# Patient Record
Sex: Male | Born: 1955 | Race: White | Hispanic: No | Marital: Married | Smoking: Never smoker
Health system: Southern US, Community
[De-identification: ages and names within clinical notes are randomized; demographics above are authoritative.]

## PROBLEM LIST (undated history)

## (undated) DIAGNOSIS — T7840XA Allergy, unspecified, initial encounter: Secondary | ICD-10-CM

## (undated) DIAGNOSIS — G4733 Obstructive sleep apnea (adult) (pediatric): Secondary | ICD-10-CM

## (undated) DIAGNOSIS — J309 Allergic rhinitis, unspecified: Secondary | ICD-10-CM

## (undated) DIAGNOSIS — F430 Acute stress reaction: Secondary | ICD-10-CM

## (undated) DIAGNOSIS — E559 Vitamin D deficiency, unspecified: Secondary | ICD-10-CM

## (undated) DIAGNOSIS — E7800 Pure hypercholesterolemia, unspecified: Secondary | ICD-10-CM

## (undated) DIAGNOSIS — H16009 Unspecified corneal ulcer, unspecified eye: Secondary | ICD-10-CM

## (undated) HISTORY — PX: COLONOSCOPY: SHX174

## (undated) HISTORY — DX: Allergy, unspecified, initial encounter: T78.40XA

## (undated) HISTORY — DX: Allergic rhinitis, unspecified: J30.9

## (undated) HISTORY — DX: Unspecified corneal ulcer, unspecified eye: H16.009

## (undated) HISTORY — PX: HERNIA REPAIR: SHX51

## (undated) HISTORY — DX: Vitamin D deficiency, unspecified: E55.9

## (undated) HISTORY — DX: Acute stress reaction: F43.0

## (undated) HISTORY — DX: Pure hypercholesterolemia, unspecified: E78.00

## (undated) HISTORY — PX: VASECTOMY: SHX75

## (undated) HISTORY — DX: Obstructive sleep apnea (adult) (pediatric): G47.33

---

## 2010-06-04 ENCOUNTER — Encounter: Admission: RE | Admit: 2010-06-04 | Discharge: 2010-06-04 | Payer: Self-pay | Admitting: Family Medicine

## 2010-08-01 ENCOUNTER — Ambulatory Visit (HOSPITAL_COMMUNITY)
Admission: RE | Admit: 2010-08-01 | Discharge: 2010-08-01 | Payer: Self-pay | Source: Home / Self Care | Admitting: General Surgery

## 2010-11-12 LAB — DIFFERENTIAL
Basophils Absolute: 0 10*3/uL (ref 0.0–0.1)
Basophils Relative: 0 % (ref 0–1)
Eosinophils Relative: 0 % (ref 0–5)
Monocytes Absolute: 0.4 10*3/uL (ref 0.1–1.0)
Monocytes Relative: 6 % (ref 3–12)

## 2010-11-12 LAB — BASIC METABOLIC PANEL
BUN: 9 mg/dL (ref 6–23)
CO2: 27 mEq/L (ref 19–32)
Chloride: 109 mEq/L (ref 96–112)
Glucose, Bld: 89 mg/dL (ref 70–99)
Potassium: 4.3 mEq/L (ref 3.5–5.1)

## 2010-11-12 LAB — CBC
HCT: 43.2 % (ref 39.0–52.0)
MCH: 27.8 pg (ref 26.0–34.0)
MCHC: 33.6 g/dL (ref 30.0–36.0)
MCV: 82.8 fL (ref 78.0–100.0)
RDW: 13.6 % (ref 11.5–15.5)

## 2010-11-12 LAB — SURGICAL PCR SCREEN: Staphylococcus aureus: NEGATIVE

## 2016-07-31 DIAGNOSIS — Z0000 Encounter for general adult medical examination without abnormal findings: Secondary | ICD-10-CM | POA: Diagnosis not present

## 2016-08-01 DIAGNOSIS — Z0000 Encounter for general adult medical examination without abnormal findings: Secondary | ICD-10-CM | POA: Diagnosis not present

## 2016-12-24 DIAGNOSIS — L814 Other melanin hyperpigmentation: Secondary | ICD-10-CM | POA: Diagnosis not present

## 2016-12-24 DIAGNOSIS — D225 Melanocytic nevi of trunk: Secondary | ICD-10-CM | POA: Diagnosis not present

## 2016-12-24 DIAGNOSIS — D1801 Hemangioma of skin and subcutaneous tissue: Secondary | ICD-10-CM | POA: Diagnosis not present

## 2016-12-24 DIAGNOSIS — L820 Inflamed seborrheic keratosis: Secondary | ICD-10-CM | POA: Diagnosis not present

## 2017-04-07 DIAGNOSIS — G4733 Obstructive sleep apnea (adult) (pediatric): Secondary | ICD-10-CM | POA: Diagnosis not present

## 2017-09-08 DIAGNOSIS — Z125 Encounter for screening for malignant neoplasm of prostate: Secondary | ICD-10-CM | POA: Diagnosis not present

## 2017-09-08 DIAGNOSIS — Z13220 Encounter for screening for lipoid disorders: Secondary | ICD-10-CM | POA: Diagnosis not present

## 2017-09-08 DIAGNOSIS — Z0000 Encounter for general adult medical examination without abnormal findings: Secondary | ICD-10-CM | POA: Diagnosis not present

## 2017-12-24 DIAGNOSIS — D1801 Hemangioma of skin and subcutaneous tissue: Secondary | ICD-10-CM | POA: Diagnosis not present

## 2017-12-24 DIAGNOSIS — L821 Other seborrheic keratosis: Secondary | ICD-10-CM | POA: Diagnosis not present

## 2017-12-24 DIAGNOSIS — D229 Melanocytic nevi, unspecified: Secondary | ICD-10-CM | POA: Diagnosis not present

## 2017-12-24 DIAGNOSIS — L814 Other melanin hyperpigmentation: Secondary | ICD-10-CM | POA: Diagnosis not present

## 2018-02-03 DIAGNOSIS — K625 Hemorrhage of anus and rectum: Secondary | ICD-10-CM | POA: Diagnosis not present

## 2018-10-21 DIAGNOSIS — G4733 Obstructive sleep apnea (adult) (pediatric): Secondary | ICD-10-CM | POA: Diagnosis not present

## 2018-10-25 DIAGNOSIS — Z125 Encounter for screening for malignant neoplasm of prostate: Secondary | ICD-10-CM | POA: Diagnosis not present

## 2018-10-25 DIAGNOSIS — F430 Acute stress reaction: Secondary | ICD-10-CM | POA: Diagnosis not present

## 2019-03-17 DIAGNOSIS — U071 COVID-19: Secondary | ICD-10-CM | POA: Diagnosis not present

## 2019-04-06 DIAGNOSIS — D1801 Hemangioma of skin and subcutaneous tissue: Secondary | ICD-10-CM | POA: Diagnosis not present

## 2019-04-06 DIAGNOSIS — L218 Other seborrheic dermatitis: Secondary | ICD-10-CM | POA: Diagnosis not present

## 2019-04-06 DIAGNOSIS — L821 Other seborrheic keratosis: Secondary | ICD-10-CM | POA: Diagnosis not present

## 2019-04-06 DIAGNOSIS — L819 Disorder of pigmentation, unspecified: Secondary | ICD-10-CM | POA: Diagnosis not present

## 2019-08-15 DIAGNOSIS — Z03818 Encounter for observation for suspected exposure to other biological agents ruled out: Secondary | ICD-10-CM | POA: Diagnosis not present

## 2019-09-09 ENCOUNTER — Ambulatory Visit: Payer: BC Managed Care – PPO | Attending: Internal Medicine

## 2019-09-09 DIAGNOSIS — Z20822 Contact with and (suspected) exposure to covid-19: Secondary | ICD-10-CM

## 2019-09-10 LAB — NOVEL CORONAVIRUS, NAA: SARS-CoV-2, NAA: NOT DETECTED

## 2019-10-18 ENCOUNTER — Ambulatory Visit: Payer: BC Managed Care – PPO | Attending: Internal Medicine

## 2019-10-18 DIAGNOSIS — Z20822 Contact with and (suspected) exposure to covid-19: Secondary | ICD-10-CM | POA: Insufficient documentation

## 2019-10-19 LAB — NOVEL CORONAVIRUS, NAA: SARS-CoV-2, NAA: NOT DETECTED

## 2019-10-27 DIAGNOSIS — Z125 Encounter for screening for malignant neoplasm of prostate: Secondary | ICD-10-CM | POA: Diagnosis not present

## 2019-10-27 DIAGNOSIS — Z0000 Encounter for general adult medical examination without abnormal findings: Secondary | ICD-10-CM | POA: Diagnosis not present

## 2019-10-27 DIAGNOSIS — Z13220 Encounter for screening for lipoid disorders: Secondary | ICD-10-CM | POA: Diagnosis not present

## 2019-11-04 ENCOUNTER — Ambulatory Visit: Payer: Self-pay | Attending: Internal Medicine

## 2019-11-04 DIAGNOSIS — Z23 Encounter for immunization: Secondary | ICD-10-CM | POA: Insufficient documentation

## 2019-11-04 NOTE — Progress Notes (Signed)
   Covid-19 Vaccination Clinic  Name:  Gary Stanton    MRN: [id number] DOB: 1955/11/09  11/04/2019  [name] was observed post Covid-19 immunization for 15 minutes without incident. He was provided with Vaccine Information Sheet and instruction to access the V-Safe system.   [name] was instructed to call 911 with any severe reactions post vaccine: Marland Kitchen Difficulty breathing  . Swelling of face and throat  . A fast heartbeat  . A bad rash all over body  . Dizziness and weakness   Immunizations Administered    Name Date Dose VIS Date Route   Pfizer COVID-19 Vaccine 11/04/2019 10:13 AM 0.3 mL 08/12/2019 Intramuscular   Manufacturer: [employer], [employer]   Lot: [id number]   NDC: [address]-3

## 2019-11-30 ENCOUNTER — Ambulatory Visit: Payer: Self-pay | Attending: Internal Medicine

## 2019-11-30 DIAGNOSIS — Z23 Encounter for immunization: Secondary | ICD-10-CM

## 2019-11-30 NOTE — Progress Notes (Signed)
   Covid-19 Vaccination Clinic  Name:  Gary Stanton    MRN: [id number] DOB: 04-12-1956  11/30/2019  [name] was observed post Covid-19 immunization for 30 minutes based on pre-vaccination screening without incident. He was provided with Vaccine Information Sheet and instruction to access the V-Safe system.   [name] was instructed to call 911 with any severe reactions post vaccine: Marland Kitchen Difficulty breathing  . Swelling of face and throat  . A fast heartbeat  . A bad rash all over body  . Dizziness and weakness   Immunizations Administered    Name Date Dose VIS Date Route   Pfizer COVID-19 Vaccine 11/30/2019  9:17 AM 0.3 mL 08/12/2019 Intramuscular   Manufacturer: [employer], [employer]   Lot: [id number]   NDC: [address]-7

## 2020-02-28 DIAGNOSIS — L821 Other seborrheic keratosis: Secondary | ICD-10-CM | POA: Diagnosis not present

## 2020-05-11 DIAGNOSIS — Z1159 Encounter for screening for other viral diseases: Secondary | ICD-10-CM | POA: Diagnosis not present

## 2020-05-15 DIAGNOSIS — Z1211 Encounter for screening for malignant neoplasm of colon: Secondary | ICD-10-CM | POA: Diagnosis not present

## 2020-05-15 DIAGNOSIS — K640 First degree hemorrhoids: Secondary | ICD-10-CM | POA: Diagnosis not present

## 2020-05-15 DIAGNOSIS — K635 Polyp of colon: Secondary | ICD-10-CM | POA: Diagnosis not present

## 2020-09-06 DIAGNOSIS — D229 Melanocytic nevi, unspecified: Secondary | ICD-10-CM | POA: Diagnosis not present

## 2020-09-06 DIAGNOSIS — D485 Neoplasm of uncertain behavior of skin: Secondary | ICD-10-CM | POA: Diagnosis not present

## 2020-09-06 DIAGNOSIS — D225 Melanocytic nevi of trunk: Secondary | ICD-10-CM | POA: Diagnosis not present

## 2020-09-06 DIAGNOSIS — L819 Disorder of pigmentation, unspecified: Secondary | ICD-10-CM | POA: Diagnosis not present

## 2020-09-06 DIAGNOSIS — L853 Xerosis cutis: Secondary | ICD-10-CM | POA: Diagnosis not present

## 2020-09-06 DIAGNOSIS — L814 Other melanin hyperpigmentation: Secondary | ICD-10-CM | POA: Diagnosis not present

## 2020-09-12 ENCOUNTER — Other Ambulatory Visit: Payer: Self-pay

## 2020-09-12 DIAGNOSIS — Z20822 Contact with and (suspected) exposure to covid-19: Secondary | ICD-10-CM | POA: Diagnosis not present

## 2020-09-14 LAB — NOVEL CORONAVIRUS, NAA: SARS-CoV-2, NAA: NOT DETECTED

## 2020-09-14 LAB — SARS-COV-2, NAA 2 DAY TAT

## 2020-09-25 DIAGNOSIS — B029 Zoster without complications: Secondary | ICD-10-CM | POA: Diagnosis not present

## 2020-10-30 DIAGNOSIS — R972 Elevated prostate specific antigen [PSA]: Secondary | ICD-10-CM | POA: Diagnosis not present

## 2020-10-30 DIAGNOSIS — Z13220 Encounter for screening for lipoid disorders: Secondary | ICD-10-CM | POA: Diagnosis not present

## 2020-10-30 DIAGNOSIS — F430 Acute stress reaction: Secondary | ICD-10-CM | POA: Diagnosis not present

## 2020-10-30 DIAGNOSIS — G4733 Obstructive sleep apnea (adult) (pediatric): Secondary | ICD-10-CM | POA: Diagnosis not present

## 2020-10-30 DIAGNOSIS — Z0000 Encounter for general adult medical examination without abnormal findings: Secondary | ICD-10-CM | POA: Diagnosis not present

## 2020-12-07 ENCOUNTER — Encounter: Payer: Self-pay | Admitting: Cardiology

## 2020-12-07 ENCOUNTER — Ambulatory Visit: Payer: BC Managed Care – PPO | Admitting: Cardiology

## 2020-12-07 ENCOUNTER — Other Ambulatory Visit: Payer: Self-pay

## 2020-12-07 ENCOUNTER — Encounter: Payer: Self-pay | Admitting: *Deleted

## 2020-12-07 VITALS — BP 128/78 | HR 72 | Ht 70.0 in | Wt 223.8 lb

## 2020-12-07 DIAGNOSIS — I4510 Unspecified right bundle-branch block: Secondary | ICD-10-CM

## 2020-12-07 DIAGNOSIS — E7800 Pure hypercholesterolemia, unspecified: Secondary | ICD-10-CM

## 2020-12-07 NOTE — Patient Instructions (Signed)
Medication Instructions:  Your physician recommends that you continue on your current medications as directed. Please refer to the Current Medication list given to you today.  *If you need a refill on your cardiac medications before your next appointment, please call your pharmacy*   Lab Work: Your physician recommends that you return for lab work on day of Lexiscan--Lipid profile.  This will be fasting  If you have labs (blood work) drawn today and your tests are completely normal, you will receive your results only by: Marland Kitchen MyChart Message (if you have MyChart) OR . A paper copy in the mail If you have any lab test that is abnormal or we need to change your treatment, we will call you to review the results.   Testing/Procedures: Your physician has requested that you have a lexiscan myoview. For further information please visit [email or web address] Please follow instruction sheet, as given.  Your physician has requested that you have an echocardiogram. Echocardiography is a painless test that uses sound waves to create images of your heart. It provides your doctor with information about the size and shape of your heart and how well your heart's chambers and valves are working. This procedure takes approximately one hour. There are no restrictions for this procedure.     Follow-Up: At [facility], you and your health needs are our priority.  As part of our continuing mission to provide you with exceptional heart care, we have created designated Provider Care Teams.  These Care Teams include your primary Cardiologist (physician) and Advanced Practice Providers (APPs -  Physician Assistants and Nurse Practitioners) who all work together to provide you with the care you need, when you need it.  We recommend signing up for the patient portal called "MyChart".  Sign up information is provided on this After Visit Summary.  MyChart is used to connect with patients for Virtual Visits  (Telemedicine).  Patients are able to view lab/test results, encounter notes, upcoming appointments, etc.  Non-urgent messages can be sent to your provider as well.   To learn more about what you can do with MyChart, go to [email or web address].    Your next appointment:   As needed  The format for your next appointment:   In Person  Provider:   You may see Armanda Magic, MD or one of the following Advanced Practice Providers on your designated Care Team:    Ronie Spies, PA-C  Jacolyn Reedy, PA-C    Other Instructions

## 2020-12-07 NOTE — Addendum Note (Signed)
Addended by: Dossie Arbour on: 12/07/2020 10:37 AM   Modules accepted: Orders

## 2020-12-07 NOTE — Addendum Note (Signed)
Addended by: Armanda Magic R on: 12/07/2020 10:46 AM   Modules accepted: Orders

## 2020-12-07 NOTE — Progress Notes (Signed)
Cardiology Office Note    Date:  12/07/2020   ID:  Gary Stanton, DOB May 09, 1956, MRN [id number]  PCP:  Daisy Floro, MD  Cardiologist:  Armanda Magic, MD   Chief Complaint  Patient presents with  . New Patient (Initial Visit)    Hyperlipidemia    History of Present Illness:  Gary Stanton is a 65 y.o. male who is being seen today for the evaluation of Hyperlipidemia at the request of Daisy Floro, MD.  This is a 65yo male with a hx of OSA on CPAP, Vit D def and HLD. He is now referred for evaluation of HLD.  His most recent labs showed a total cholesterol at 330, LDL 138, HDL 34 and TAGs 338.  He denies any chest pain or pressure, SOB, DOE, PND, orthopnea, LE edema, dizziness, palpitations or syncope. He is compliant with his meds and is tolerating meds with no SE.  He has no family hx of CAD and he has never smoked.    Past Medical History:  Diagnosis Date  . Allergic rhinitis   . Allergies   . Corneal erosion   . Hypercholesterolemia   . OSA on CPAP   . Stress reaction   . Vitamin D deficiency       Current Medications: Current Meds  Medication Sig  . fexofenadine (ALLEGRA) 180 MG tablet Take 180 mg by mouth daily.  . Multiple Vitamin (MULTIVITAMIN) tablet Take 1 tablet by mouth daily.  Marland Kitchen venlafaxine (EFFEXOR) 75 MG tablet Take 75 mg by mouth daily.    Allergies:   Other   Social History   Socioeconomic History  . Marital status: Married    Spouse name: Not on file  . Number of children: Not on file  . Years of education: Not on file  . Highest education level: Not on file  Occupational History  . Not on file  Tobacco Use  . Smoking status: Never Smoker  . Smokeless tobacco: Never Used  Substance and Sexual Activity  . Alcohol use: Yes    Comment: rarely social  . Drug use: Never  . Sexual activity: Not on file  Other Topics Concern  . Not on file  Social History Narrative  . Not on file   Social Determinants of Health    Financial Resource Strain: Not on file  Food Insecurity: Not on file  Transportation Needs: Not on file  Physical Activity: Not on file  Stress: Not on file  Social Connections: Not on file     Family History:  The patient's family history includes Alzheimer's disease in his father; COPD in his mother; CVA in his brother; Leukemia in his father; Obesity in his brother.   ROS:   Please see the history of present illness.    ROS All other systems reviewed and are negative.  No flowsheet data found.   PHYSICAL EXAM:   VS:  BP 128/78   Pulse 72   Ht 5\' 10"  (1.778 m)   Wt 223 lb 12.8 oz (101.5 kg)   SpO2 96%   BMI 32.11 kg/m    GEN: Well nourished, well developed, in no acute distress  HEENT: normal  Neck: no JVD, carotid bruits, or masses Cardiac: RRR; no murmurs, rubs, or gallops,no edema.  Intact distal pulses bilaterally.  Respiratory:  clear to auscultation bilaterally, normal work of breathing GI: soft, nontender, nondistended, + BS MS: no deformity or atrophy  Skin: warm and dry, no rash Neuro:  Alert and Oriented x 3, Strength and sensation are intact Psych: euthymic mood, full affect  Wt Readings from Last 3 Encounters:  12/07/20 223 lb 12.8 oz (101.5 kg)      Studies/Labs Reviewed:   EKG:  EKG is ordered today.  The ekg ordered today demonstrates NSR with RBBB  Recent Labs: No results found for requested labs within last 8760 hours.   Lipid Panel No results found for: CHOL, TRIG, HDL, CHOLHDL, VLDL, LDLCALC, LDLDIRECT   Additional studies/ records that were reviewed today include:  OV notes from PCP    ASSESSMENT:    1. Pure hypercholesterolemia   2. RBBB      PLAN:  In order of problems listed above:  Hyperlipidemia -total cholesterol at 330, LDL 138, HDL 34 and TAGs 338.   -he had been on Crestor at one point but then stopped after he lost weight and lipids improved -this was a non fasting panel -repeat fasting lipids  RBBB -he has  never had an EKG done before to know if this is new -he is completely asymptomatic but his lipids are very abnormal -he is not very active -I will get a 2D echo to assess LVF -check Lexiscan myoview to rule out silent ischemia -Shared Decision Making/Informed Consen The risks [chest pain, shortness of breath, cardiac arrhythmias, dizziness, blood pressure fluctuations, myocardial infarction, stroke/transient ischemic attack, nausea, vomiting, allergic reaction, radiation exposure, metallic taste sensation and life-threatening complications (estimated to be 1 in 10,000)], benefits (risk stratification, diagnosing coronary artery disease, treatment guidance) and alternatives of a nuclear stress test were discussed in detail with Mr. Ferrall and he agrees to proceed.   Medication Adjustments/Labs and Tests Ordered: Current medicines are reviewed at length with the patient today.  Concerns regarding medicines are outlined above.  Medication changes, Labs and Tests ordered today are listed in the Patient Instructions below.  There are no Patient Instructions on file for this visit.   Signed, Armanda Magic, MD  12/07/2020 10:21 AM    [facility] [address] [place], [place], [place]  [address] Phone: [phone]; Fax: [phone]

## 2020-12-17 DIAGNOSIS — R972 Elevated prostate specific antigen [PSA]: Secondary | ICD-10-CM | POA: Diagnosis not present

## 2021-01-09 ENCOUNTER — Telehealth: Payer: Self-pay

## 2021-01-09 NOTE — Telephone Encounter (Signed)
Detailed instructions left on the patient's answering machine. Asked to call back with any questions. S.Maxxon Schwanke EMTP 

## 2021-01-10 ENCOUNTER — Ambulatory Visit (HOSPITAL_BASED_OUTPATIENT_CLINIC_OR_DEPARTMENT_OTHER): Payer: BC Managed Care – PPO

## 2021-01-10 ENCOUNTER — Other Ambulatory Visit: Payer: BC Managed Care – PPO

## 2021-01-10 ENCOUNTER — Other Ambulatory Visit: Payer: Self-pay

## 2021-01-10 ENCOUNTER — Ambulatory Visit (HOSPITAL_COMMUNITY): Payer: BC Managed Care – PPO | Attending: Cardiology

## 2021-01-10 DIAGNOSIS — E7800 Pure hypercholesterolemia, unspecified: Secondary | ICD-10-CM | POA: Diagnosis not present

## 2021-01-10 DIAGNOSIS — I4510 Unspecified right bundle-branch block: Secondary | ICD-10-CM | POA: Diagnosis not present

## 2021-01-10 LAB — ECHOCARDIOGRAM COMPLETE
Area-P 1/2: 3.2 cm2
Height: 70 in
S' Lateral: 2.8 cm
Weight: 3568 oz

## 2021-01-10 LAB — MYOCARDIAL PERFUSION IMAGING
LV dias vol: 83 mL (ref 62–150)
LV sys vol: 39 mL
Peak HR: 85 {beats}/min
Rest HR: 64 {beats}/min
SDS: 3
SRS: 1
SSS: 4
TID: 0.96

## 2021-01-10 LAB — LIPID PANEL
Chol/HDL Ratio: 5.4 ratio — ABNORMAL HIGH (ref 0.0–5.0)
Cholesterol, Total: 178 mg/dL (ref 100–199)
HDL: 33 mg/dL — ABNORMAL LOW (ref >39)
LDL Chol Calc (NIH): 121 mg/dL — ABNORMAL HIGH (ref 0–99)
Triglycerides: 130 mg/dL (ref 0–149)
VLDL Cholesterol Cal: 24 mg/dL (ref 5–40)

## 2021-01-10 MED ORDER — TECHNETIUM TC 99M TETROFOSMIN IV KIT
32.5000 | PACK | Freq: Once | INTRAVENOUS | Status: AC | PRN
  Administered 2021-01-10: 32.5 via INTRAVENOUS
  Filled 2021-01-10: qty 33

## 2021-01-10 MED ORDER — REGADENOSON 0.4 MG/5ML IV SOLN
0.4000 mg | Freq: Once | INTRAVENOUS | Status: AC
  Administered 2021-01-10: 0.4 mg via INTRAVENOUS

## 2021-01-10 MED ORDER — TECHNETIUM TC 99M TETROFOSMIN IV KIT
10.2000 | PACK | Freq: Once | INTRAVENOUS | Status: AC | PRN
  Administered 2021-01-10: 10.2 via INTRAVENOUS
  Filled 2021-01-10: qty 11

## 2021-01-11 ENCOUNTER — Telehealth: Payer: Self-pay | Admitting: Cardiology

## 2021-01-11 NOTE — Telephone Encounter (Signed)
Patient returning call for echo results. 

## 2021-03-26 DIAGNOSIS — L905 Scar conditions and fibrosis of skin: Secondary | ICD-10-CM | POA: Diagnosis not present

## 2021-03-26 DIAGNOSIS — D485 Neoplasm of uncertain behavior of skin: Secondary | ICD-10-CM | POA: Diagnosis not present

## 2021-06-14 DIAGNOSIS — U071 COVID-19: Secondary | ICD-10-CM | POA: Diagnosis not present

## 2021-09-26 DIAGNOSIS — L818 Other specified disorders of pigmentation: Secondary | ICD-10-CM | POA: Diagnosis not present

## 2021-09-26 DIAGNOSIS — D225 Melanocytic nevi of trunk: Secondary | ICD-10-CM | POA: Diagnosis not present

## 2021-09-26 DIAGNOSIS — L814 Other melanin hyperpigmentation: Secondary | ICD-10-CM | POA: Diagnosis not present

## 2021-09-26 DIAGNOSIS — L821 Other seborrheic keratosis: Secondary | ICD-10-CM | POA: Diagnosis not present

## 2021-10-30 DIAGNOSIS — Z0000 Encounter for general adult medical examination without abnormal findings: Secondary | ICD-10-CM | POA: Diagnosis not present

## 2021-10-30 DIAGNOSIS — E559 Vitamin D deficiency, unspecified: Secondary | ICD-10-CM | POA: Diagnosis not present

## 2021-10-30 DIAGNOSIS — Z13220 Encounter for screening for lipoid disorders: Secondary | ICD-10-CM | POA: Diagnosis not present

## 2021-10-30 DIAGNOSIS — Z125 Encounter for screening for malignant neoplasm of prostate: Secondary | ICD-10-CM | POA: Diagnosis not present

## 2021-11-06 DIAGNOSIS — Z0000 Encounter for general adult medical examination without abnormal findings: Secondary | ICD-10-CM | POA: Diagnosis not present

## 2021-11-07 ENCOUNTER — Other Ambulatory Visit: Payer: Self-pay | Admitting: Family Medicine

## 2021-11-07 DIAGNOSIS — E7800 Pure hypercholesterolemia, unspecified: Secondary | ICD-10-CM

## 2021-12-10 ENCOUNTER — Ambulatory Visit
Admission: RE | Admit: 2021-12-10 | Discharge: 2021-12-10 | Disposition: A | Discharge status: Home / Self Care | Payer: BC Managed Care – PPO | Source: Ambulatory Visit | Attending: Family Medicine | Admitting: Family Medicine

## 2021-12-10 DIAGNOSIS — E7800 Pure hypercholesterolemia, unspecified: Secondary | ICD-10-CM

## 2022-04-21 IMAGING — CT CT CARDIAC CORONARY ARTERY CALCIUM SCORE
3 series · 14 of 20 positions shown, 16 images · non-contrast
Comparison: None.

CLINICAL DATA: 65-year-old Caucasian male with history of
hyperlipidemia.

EXAM:
CT CARDIAC CORONARY ARTERY CALCIUM SCORE
TECHNIQUE: Non-contrast imaging through the heart was performed using
prospective ECG gating. Image post processing was performed on an
independent workstation, allowing for quantitative analysis of the
heart and coronary arteries. Note that this exam targets the heart
and the chest was not imaged in its entirety.

[Series 2: calcium scoring 2.00 qr36 bestdiast 70% hrt calciu · axial · 0.42mm/px · z∈[+1562,+1658]mm · 4 of 80 slices shown]
[im 16/80  vessel]
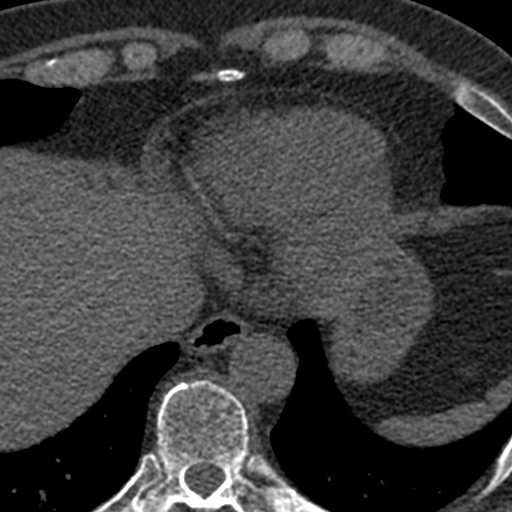
[im 32/80  vessel]
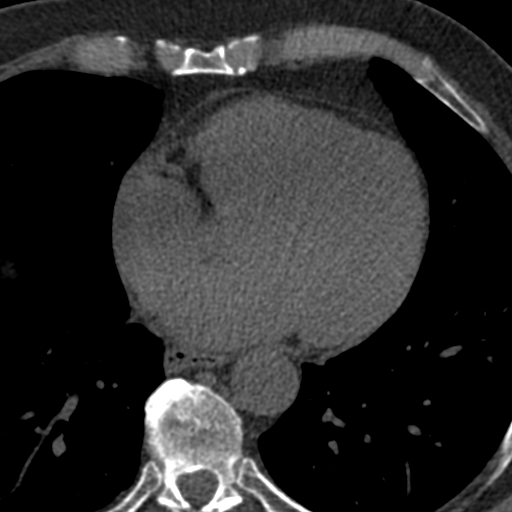
[im 48/80  vessel]
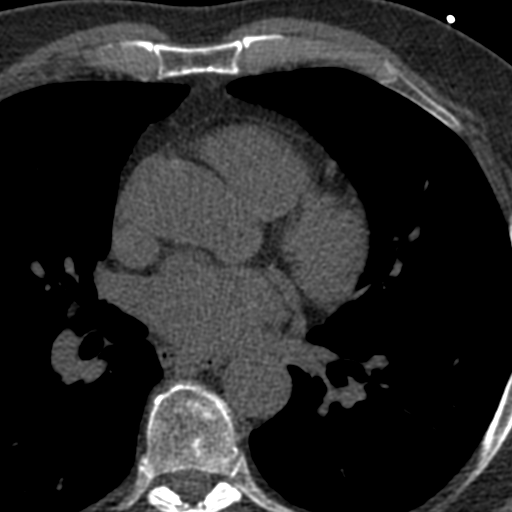
[im 64/80  vessel]
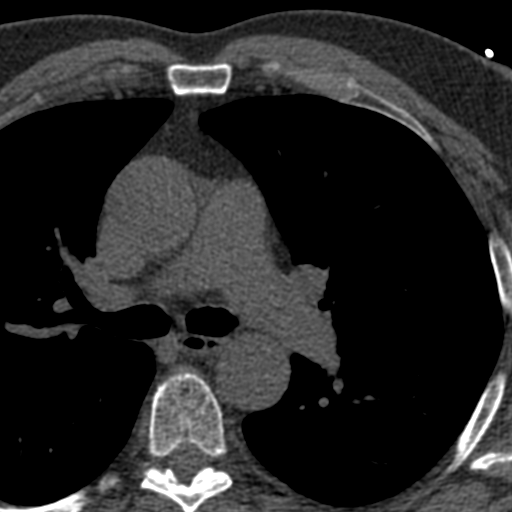

[Series 3: calcium scoring 2.00 br40 bestdiast 70% axial · axial · 0.61mm/px · z∈[+1558,+1662]mm · 5 of 80 slices shown, 7 images]
[im 14/80  vessel]
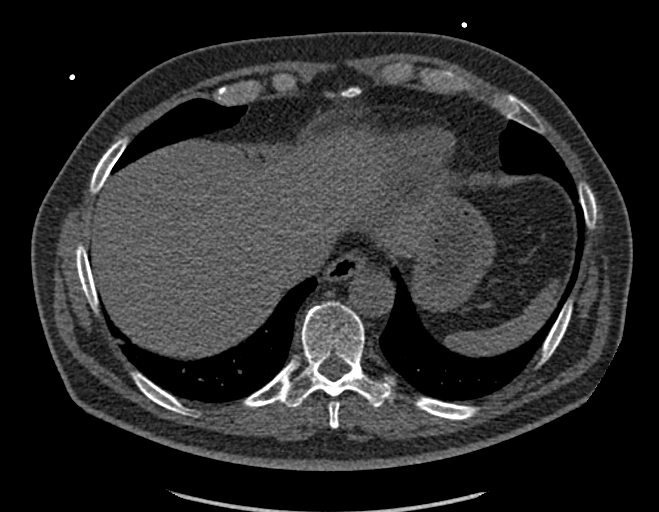
[im 14/80  lung]
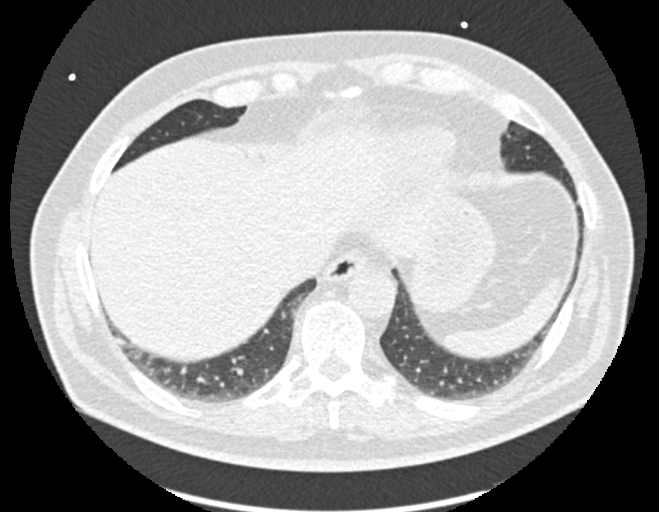
[im 27/80  vessel]
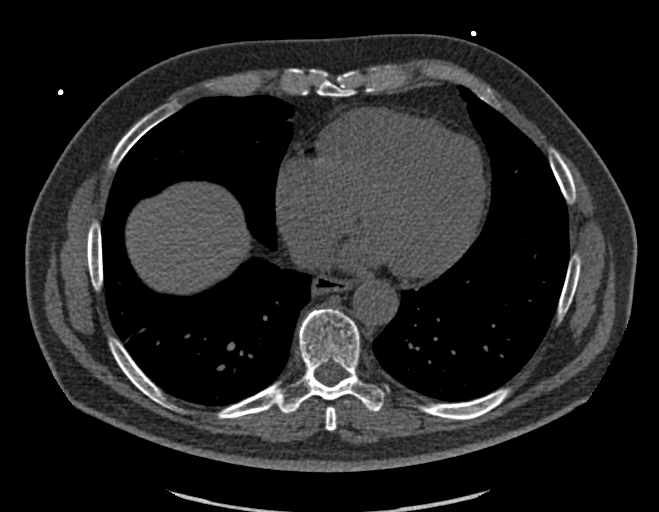
[im 40/80  vessel]
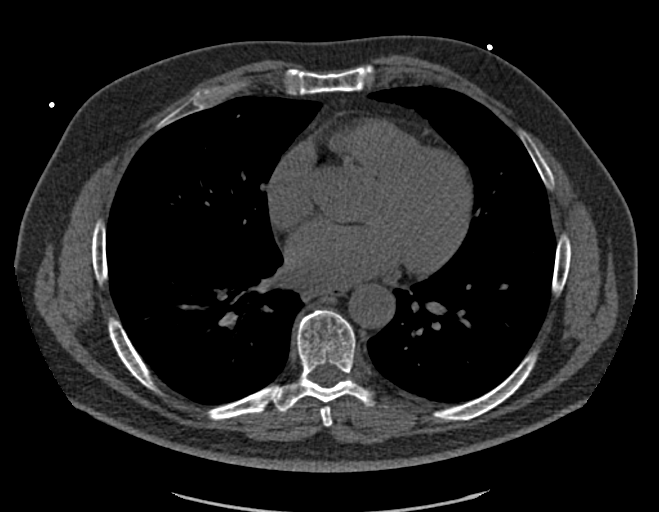
[im 53/80  vessel]
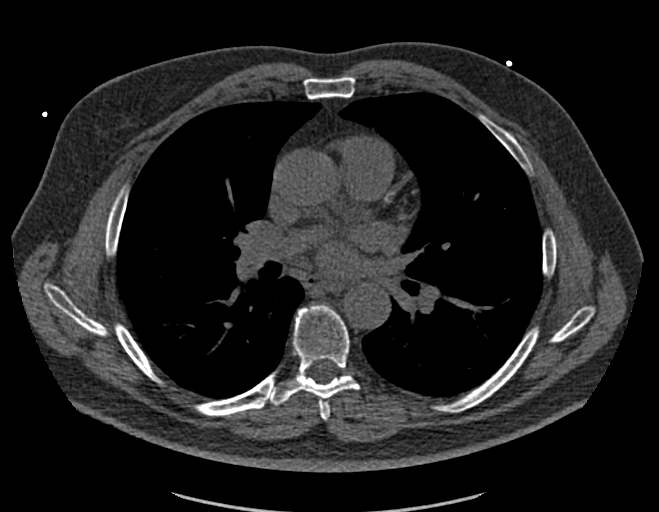
[im 66/80  vessel]
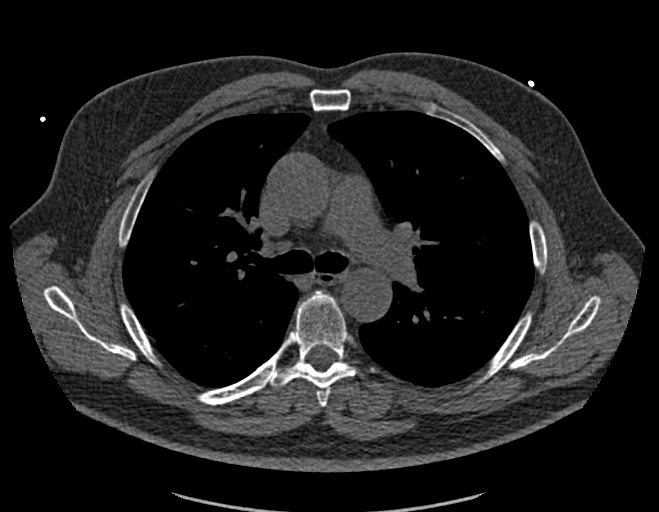
[im 66/80  lung]
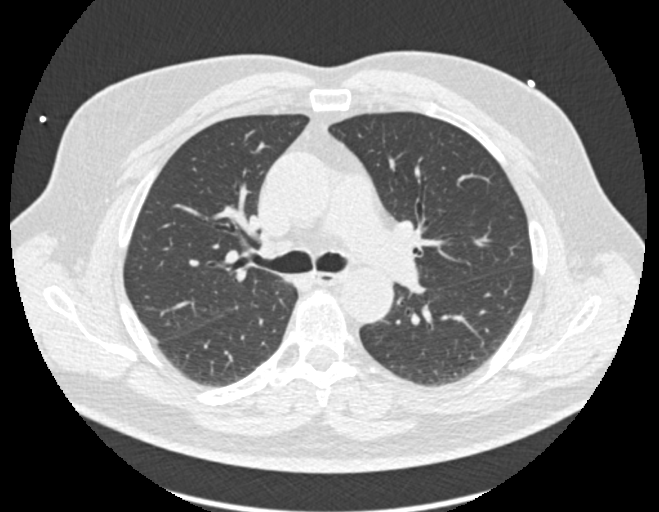

[Series 9: calcium scoring 2.00 br60 bestdiast 70% lungs · axial · 0.61mm/px · z∈[+1558,+1662]mm · 5 of 80 slices shown]
[im 14/80  vessel]
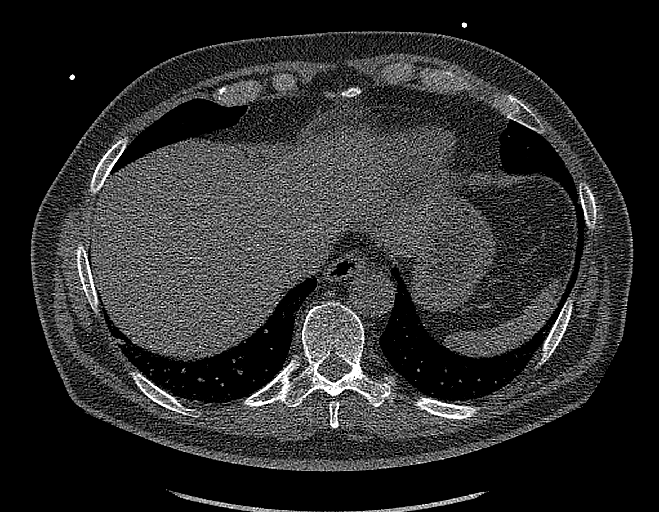
[im 27/80  vessel]
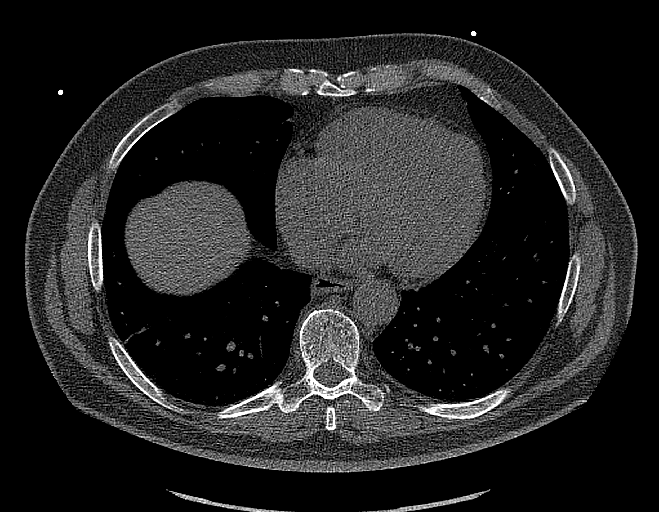
[im 40/80  vessel]
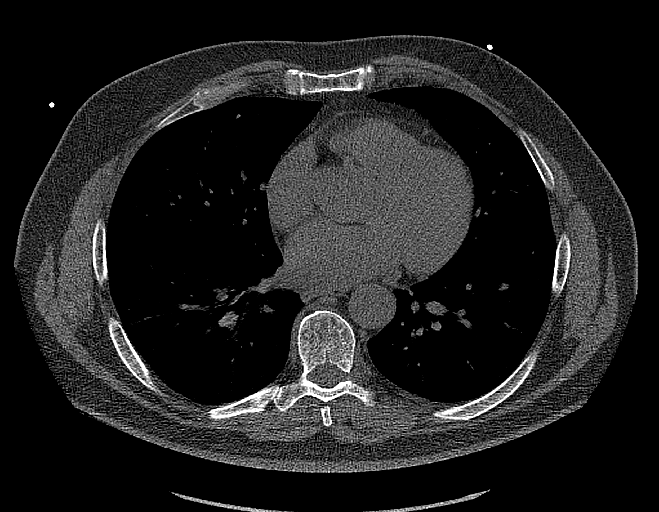
[im 53/80  vessel]
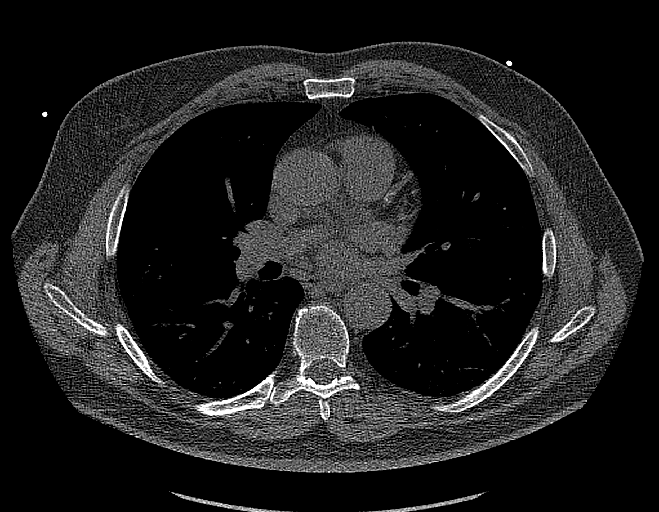
[im 66/80  vessel]
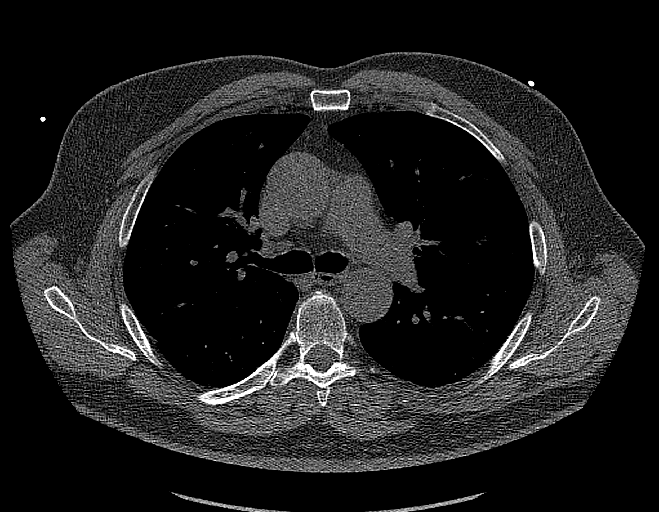

[14 of 20 positions shown; findings below may reference images not displayed]

FINDINGS: CORONARY CALCIUM SCORES:

Left Main: 0

LAD:

LCx:

RCA: 0

Total Agatston Score:

[HOSPITAL] percentile: 44th

AORTA MEASUREMENTS:

Ascending Aorta: 38 mm

Descending Aorta: 29 mm

OTHER FINDINGS:

Cardiovascular: The heart is normal in size. No pericardial
effusion. Minimal atherosclerotic calcification about the aortic
isthmus.

Mediastinum/Nodes: No mediastinal lymphadenopathy. The visualized
trachea and esophagus are within normal limits.

Lungs/Pleura: Single solid pulmonary nodule anterior right middle
lobe measuring up to 5 mm. No focal consolidations, or evidence of
pneumothorax or pleural effusion.

Upper Abdomen: Within normal limits.

Musculoskeletal: No acute osseous abnormality.
IMPRESSION: 1. Atherosclerotic calcifications of the left anterior descending
and left circumflex coronary arteries. The observed calcium score of
33.6 is at the forty-fourth percentile for subjects of the same age,
gender, and race/ethnicity who are free of clinical cardiovascular
disease and treated diabetes.
2. Right middle lobe solid pulmonary nodule measuring up to 5 mm. No
follow-up needed if patient is low-risk.This recommendation follows
the consensus statement: Guidelines for Management of Incidental
Pulmonary Nodules Detected on CT Images: From the [HOSPITAL]

## 2022-07-21 DIAGNOSIS — J40 Bronchitis, not specified as acute or chronic: Secondary | ICD-10-CM | POA: Diagnosis not present

## 2022-07-21 DIAGNOSIS — J0100 Acute maxillary sinusitis, unspecified: Secondary | ICD-10-CM | POA: Diagnosis not present

## 2022-09-29 DIAGNOSIS — L821 Other seborrheic keratosis: Secondary | ICD-10-CM | POA: Diagnosis not present

## 2022-09-29 DIAGNOSIS — Z09 Encounter for follow-up examination after completed treatment for conditions other than malignant neoplasm: Secondary | ICD-10-CM | POA: Diagnosis not present

## 2022-09-29 DIAGNOSIS — D2372 Other benign neoplasm of skin of left lower limb, including hip: Secondary | ICD-10-CM | POA: Diagnosis not present

## 2022-09-29 DIAGNOSIS — L814 Other melanin hyperpigmentation: Secondary | ICD-10-CM | POA: Diagnosis not present

## 2022-10-30 DIAGNOSIS — G4733 Obstructive sleep apnea (adult) (pediatric): Secondary | ICD-10-CM | POA: Diagnosis not present

## 2022-11-11 DIAGNOSIS — Z0000 Encounter for general adult medical examination without abnormal findings: Secondary | ICD-10-CM | POA: Diagnosis not present

## 2022-11-11 DIAGNOSIS — Z13220 Encounter for screening for lipoid disorders: Secondary | ICD-10-CM | POA: Diagnosis not present

## 2022-11-11 DIAGNOSIS — E559 Vitamin D deficiency, unspecified: Secondary | ICD-10-CM | POA: Diagnosis not present

## 2022-11-11 DIAGNOSIS — Z125 Encounter for screening for malignant neoplasm of prostate: Secondary | ICD-10-CM | POA: Diagnosis not present

## 2022-11-17 DIAGNOSIS — Z0000 Encounter for general adult medical examination without abnormal findings: Secondary | ICD-10-CM | POA: Diagnosis not present

## 2022-11-24 DIAGNOSIS — G4733 Obstructive sleep apnea (adult) (pediatric): Secondary | ICD-10-CM | POA: Diagnosis not present

## 2022-12-03 DIAGNOSIS — G4733 Obstructive sleep apnea (adult) (pediatric): Secondary | ICD-10-CM | POA: Diagnosis not present

## 2023-01-02 DIAGNOSIS — G4733 Obstructive sleep apnea (adult) (pediatric): Secondary | ICD-10-CM | POA: Diagnosis not present

## 2023-01-29 DIAGNOSIS — G4733 Obstructive sleep apnea (adult) (pediatric): Secondary | ICD-10-CM | POA: Diagnosis not present

## 2023-02-02 DIAGNOSIS — G4733 Obstructive sleep apnea (adult) (pediatric): Secondary | ICD-10-CM | POA: Diagnosis not present

## 2023-02-11 DIAGNOSIS — J3089 Other allergic rhinitis: Secondary | ICD-10-CM | POA: Diagnosis not present

## 2023-02-11 DIAGNOSIS — J301 Allergic rhinitis due to pollen: Secondary | ICD-10-CM | POA: Diagnosis not present

## 2023-02-11 DIAGNOSIS — J3081 Allergic rhinitis due to animal (cat) (dog) hair and dander: Secondary | ICD-10-CM | POA: Diagnosis not present

## 2023-02-11 DIAGNOSIS — H1045 Other chronic allergic conjunctivitis: Secondary | ICD-10-CM | POA: Diagnosis not present

## 2023-03-04 DIAGNOSIS — G4733 Obstructive sleep apnea (adult) (pediatric): Secondary | ICD-10-CM | POA: Diagnosis not present

## 2023-03-16 DIAGNOSIS — T781XXA Other adverse food reactions, not elsewhere classified, initial encounter: Secondary | ICD-10-CM | POA: Diagnosis not present

## 2023-11-12 ENCOUNTER — Ambulatory Visit (INDEPENDENT_AMBULATORY_CARE_PROVIDER_SITE_OTHER): Payer: Medicare Other | Admitting: Neurology

## 2023-11-12 ENCOUNTER — Encounter: Payer: Self-pay | Admitting: Neurology

## 2023-11-12 ENCOUNTER — Telehealth: Payer: Self-pay | Admitting: Anesthesiology

## 2023-11-12 VITALS — BP 127/72 | HR 78 | Ht 70.0 in | Wt 213.0 lb

## 2023-11-12 DIAGNOSIS — R413 Other amnesia: Secondary | ICD-10-CM

## 2023-11-12 NOTE — Progress Notes (Signed)
 [facility]  PATIENT: Gary Stanton DOB: 21-Apr-1956  REQUESTING CLINICIAN: Daisy Floro, MD HISTORY FROM: Patient and spouse  REASON FOR VISIT: memory loss    HISTORICAL  CHIEF COMPLAINT:  Chief Complaint  Patient presents with   New Patient (Initial Visit)    Pt in 13,  here with wife Kennon Rounds  Pt is referred by Dr Tenny Craw for memory changes.     HISTORY OF PRESENT ILLNESS:  This is a 68 year old gentleman with family history of dementia who is presenting with concern of memory loss.  Patient tells me that sometimes he is forgetful, but otherwise he is mainly concerned due to his family history of dementia (Father and Paternal grandfather) and wanting to get more information about the new monoclonal antibodies.   Wife tells me that patient does have decreased attention, during conversation he can asked the same question that was answered already.  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 He tells me that he also has hearing loss but he feels like these episodes are improving.  Other than that he is independent all ADLs, lives with wife and does not have any major concerns.  He just wants to be evaluated, and see if he is a candidate for the new monoclonal antibodies.   TBI:   No past history of TBI Stroke:   no past history of stroke Seizures:   no past history of seizures Sleep: Yes, using CPAP   Mood: Depression on Venlafaxine  Family history of Dementia:  Father diagnosed at 8 and Paternal grandfather with dementia   Functional status: independent in all ADLs and IADLs Patient lives with spouse. Cooking: no issues Cleaning: no issues Shopping: no issues Bathing: no issues Toileting: no issues Driving: no issues Bills: no issues Medications: no issues Ever left the stove on by accident?: denies  Forget how to use items around the house?: denies  Getting lost going  to familiar places?: denies  Forgetting loved ones names?: denies  Word finding difficulty? Denies Sleep: no issues    OTHER MEDICAL CONDITIONS: Depression,  OSA on CPAP   REVIEW OF SYSTEMS: Full 14 system review of systems performed and negative with exception of: As noted in the HPI  ALLERGIES: Allergies  Allergen Reactions   Other     Pine nuts, hazel nuts, tree nuts    HOME MEDICATIONS: Outpatient Medications Prior to Visit  Medication Sig Dispense Refill   fexofenadine (ALLEGRA) 180 MG tablet Take 180 mg by mouth daily.     Magnesium 400 MG CAPS Take by mouth.     Multiple Vitamin (MULTIVITAMIN) tablet Take 1 tablet by mouth daily.     venlafaxine (EFFEXOR) 75 MG tablet Take 75 mg by mouth daily.     No facility-administered medications prior to visit.    PAST MEDICAL HISTORY: Past Medical History:  Diagnosis Date   Allergic rhinitis    Allergies    Corneal erosion    Hypercholesterolemia    OSA on CPAP    Stress reaction    Vitamin D deficiency     PAST SURGICAL HISTORY: Past Surgical History:  Procedure Laterality Date   COLONOSCOPY     2009 and 2019   HERNIA REPAIR     2009   VASECTOMY     1992    FAMILY HISTORY: Family History  Problem Relation Age of Onset   COPD Mother  Leukemia Father    Alzheimer's disease Father    Obesity Brother    CVA Brother     SOCIAL HISTORY: Social History   Socioeconomic History   Marital status: Married    Spouse name: Not on file   Number of children: Not on file   Years of education: Not on file   Highest education level: Not on file  Occupational History   Not on file  Tobacco Use   Smoking status: Never   Smokeless tobacco: Never  Vaping Use   Vaping status: Never Used  Substance and Sexual Activity   Alcohol use: Yes    Comment: rarely social   Drug use: Never   Sexual activity: Not on file  Other Topics Concern   Not on file  Social History Narrative   Not on file   Social Drivers  of Health   Financial Resource Strain: Not on file  Food Insecurity: Not on file  Transportation Needs: Not on file  Physical Activity: Not on file  Stress: Not on file  Social Connections: Not on file  Intimate Partner Violence: Not on file    PHYSICAL EXAM  GENERAL EXAM/CONSTITUTIONAL: Vitals:  Vitals:   11/12/23 0923  BP: 127/72  Pulse: 78  Weight: 213 lb (96.6 kg)  Height: 5\' 10"  (1.778 m)   Body mass index is 30.56 kg/m. Wt Readings from Last 3 Encounters:  11/12/23 213 lb (96.6 kg)  01/10/21 223 lb (101.2 kg)  12/07/20 223 lb 12.8 oz (101.5 kg)   Patient is in no distress; well developed, nourished and groomed; neck is supple  MUSCULOSKELETAL: Gait, strength, tone, movements noted in Neurologic exam below  NEUROLOGIC: MENTAL STATUS:      No data to display         awake, alert, oriented to person, place and time recent and remote memory intact normal attention and concentration language fluent, comprehension intact, naming intact fund of knowledge appropriate     11/12/2023    9:27 AM  Montreal Cognitive Assessment   Visuospatial/ Executive (0/5) 5  Naming (0/3) 3  Attention: Read list of digits (0/2) 2  Attention: Read list of letters (0/1) 1  Attention: Serial 7 subtraction starting at 100 (0/3) 3  Language: Repeat phrase (0/2) 2  Language : Fluency (0/1) 1  Abstraction (0/2) 2  Delayed Recall (0/5) 3  Orientation (0/6) 6  Total 28    CRANIAL NERVE:  2nd, 3rd, 4th, 6th- visual fields full to confrontation, extraocular muscles intact, no nystagmus 5th - facial sensation symmetric 7th - facial strength symmetric 8th - hearing intact 9th - palate elevates symmetrically, uvula midline 11th - shoulder shrug symmetric 12th - tongue protrusion midline  MOTOR:  normal bulk and tone, full strength in the BUE, BLE  SENSORY:  normal and symmetric to light touch  COORDINATION:  finger-nose-finger, fine finger movements  normal  GAIT/STATION:  normal   DIAGNOSTIC DATA (LABS, IMAGING, TESTING) - I reviewed patient records, labs, notes, testing and imaging myself where available.  Lab Results  Component Value Date   WBC 6.0 07/31/2010   HGB 14.5 07/31/2010   HCT 43.2 07/31/2010   MCV 82.8 07/31/2010   PLT 236 07/31/2010      Component Value Date/Time   NA 144 07/31/2010 0930   K 4.3 07/31/2010 0930   CL 109 07/31/2010 0930   CO2 27 07/31/2010 0930   GLUCOSE 89 07/31/2010 0930   BUN 9 07/31/2010 0930   CREATININE 0.97 07/31/2010  0930   CALCIUM 9.4 07/31/2010 0930   GFRNONAA >60 07/31/2010 0930   GFRAA  07/31/2010 0930    >60        The eGFR has been calculated using the MDRD equation. This calculation has not been validated in all clinical situations. eGFR's persistently <60 mL/min signify possible Chronic Kidney Disease.   Lab Results  Component Value Date   CHOL 178 01/10/2021   HDL 33 (L) 01/10/2021   LDLCALC 121 (H) 01/10/2021   TRIG 130 01/10/2021   CHOLHDL 5.4 (H) 01/10/2021   No results found for: "HGBA1C" No results found for: "VITAMINB12" No results found for: "TSH"    ASSESSMENT AND PLAN  68 y.o. year old male with sleep apnea, family history of dementia who is presenting to be evaluated for memory concern.  Basically he would like to know if he is a good candidate for the new monoclonal antibodies.  He scored 28/30 on the MoCA.  Plan will be initially to obtain the dementia lab including ATN profile, and ApoE4 genotype.  If ATN abnormal, we will proceed with a PET amyloid to confirm the presence of amyloid plaque.  I will contact the patient to go over the result.  If indeed he does have Alzheimer's disease pathology, he might be a candidate for these new monoclonal antibodies.  Continue to follow with PCP return for any other concerns.   1. Memory loss      Patient Instructions  Will obtain dementia labs including B12, TSH, ApoE4 genotype and ATN profile If ATN  positive, then we will proceed with amyloid PET scan  MRI Brain without contrast  Continue to follow up with PCP  Return if worse   Orders Placed This Encounter  Procedures   MR BRAIN WO CONTRAST   TSH   Vitamin B12   ATN PROFILE   APOE Alzheimer's Risk    No orders of the defined types were placed in this encounter.   Return if symptoms worsen or fail to improve.    Windell Norfolk, MD 11/12/2023, 11:21 AM  [facility] [address] [place], [place] [address] [phone]

## 2023-11-12 NOTE — Patient Instructions (Addendum)
 Will obtain dementia labs including B12, TSH, ApoE4 genotype and ATN profile If ATN positive, then we will proceed with amyloid PET scan  MRI Brain without contrast  Continue to follow up with PCP  Return if worse

## 2023-11-19 ENCOUNTER — Encounter: Payer: Self-pay | Admitting: Neurology

## 2023-11-19 LAB — APOE ALZHEIMER'S RISK

## 2023-11-19 LAB — ATN PROFILE
A -- Beta-amyloid 42/40 Ratio: 0.099 — ABNORMAL LOW (ref >0.102)
Beta-amyloid 40: 156.83 pg/mL
Beta-amyloid 42: 15.58 pg/mL
N -- NfL, Plasma: 2.04 pg/mL (ref 0.00–4.61)
T -- p-tau181: 0.82 pg/mL (ref 0.00–0.97)

## 2023-11-19 LAB — VITAMIN B12: Vitamin B-12: 597 pg/mL (ref 232–1245)

## 2023-11-19 LAB — TSH: TSH: 0.78 u[IU]/mL (ref 0.450–4.500)

## 2023-11-30 ENCOUNTER — Ambulatory Visit
Admission: RE | Admit: 2023-11-30 | Discharge: 2023-11-30 | Disposition: A | Discharge status: Home / Self Care | Source: Ambulatory Visit | Attending: Neurology | Admitting: Neurology

## 2023-11-30 DIAGNOSIS — R413 Other amnesia: Secondary | ICD-10-CM | POA: Diagnosis not present

## 2023-12-01 ENCOUNTER — Encounter: Payer: Self-pay | Admitting: Neurology
